# Patient Record
Sex: Female | Born: 2000 | Race: Black or African American | Hispanic: No | Marital: Single | Smoking: Never smoker
Health system: Southern US, Community
[De-identification: ages and names within clinical notes are randomized; demographics above are authoritative.]

---
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
## 2022-10-30 IMAGING — US US OB < 14 WEEKS - US OB TV
1 series · 15 of 28 positions shown · non-contrast
Comparison: None.

CLINICAL DATA: Initial evaluation for pelvic pain, early pregnancy.

EXAM:
OBSTETRIC <14 WK US AND TRANSVAGINAL OB US
TECHNIQUE: Both transabdominal and transvaginal ultrasound examinations were
performed for complete evaluation of the gestation as well as the
maternal uterus, adnexal regions, and pelvic cul-de-sac.
Transvaginal technique was performed to assess early pregnancy.

[Series 1: us ob < 14 weeks - us ob tv · 15 of 45 slices shown]
[im 1/45]
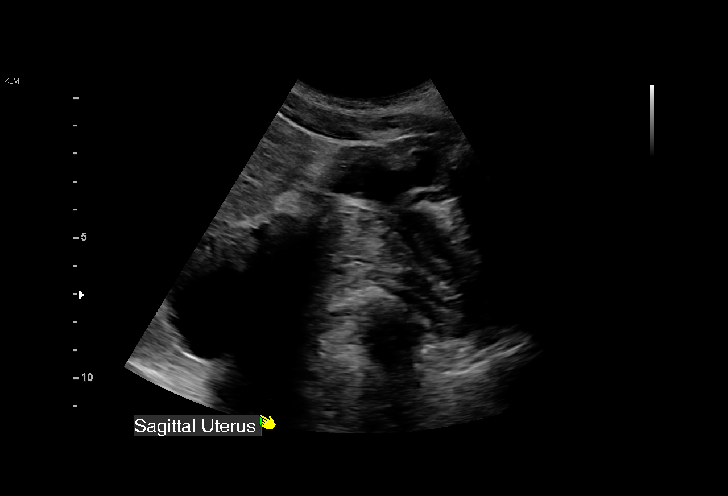
[im 4/45]
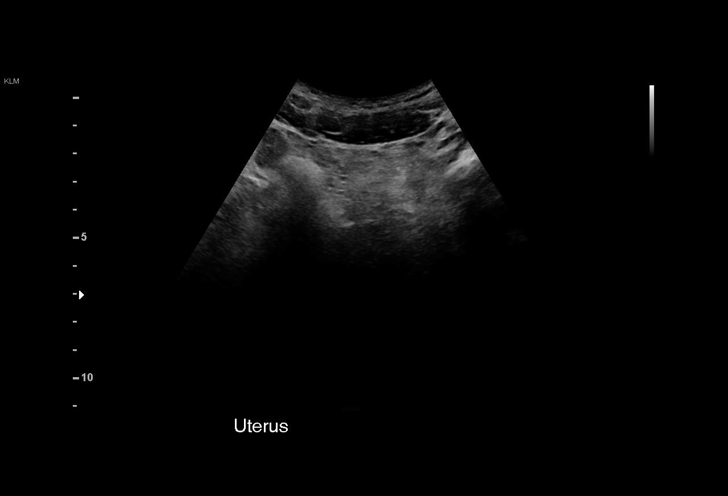
[im 7/45]
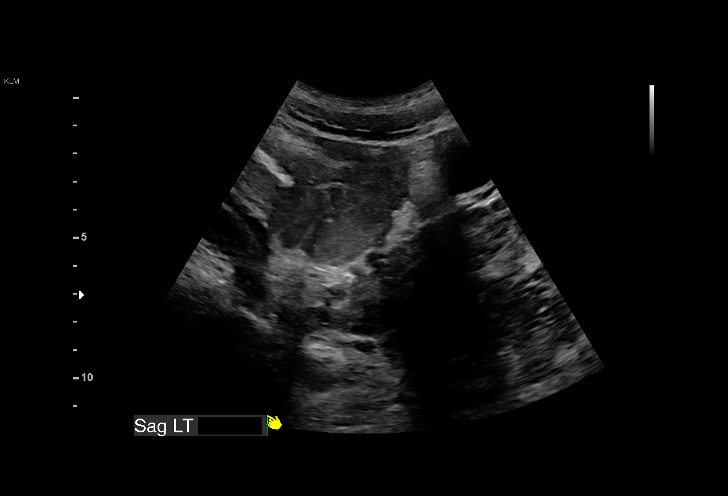
[im 10/45]
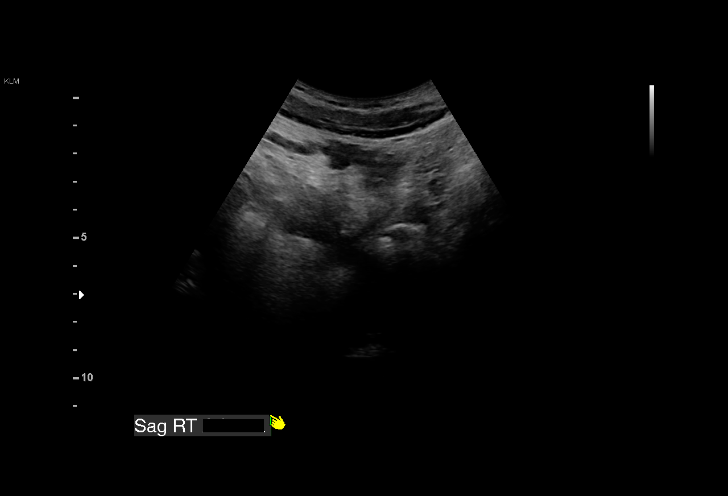
[im 14/45]
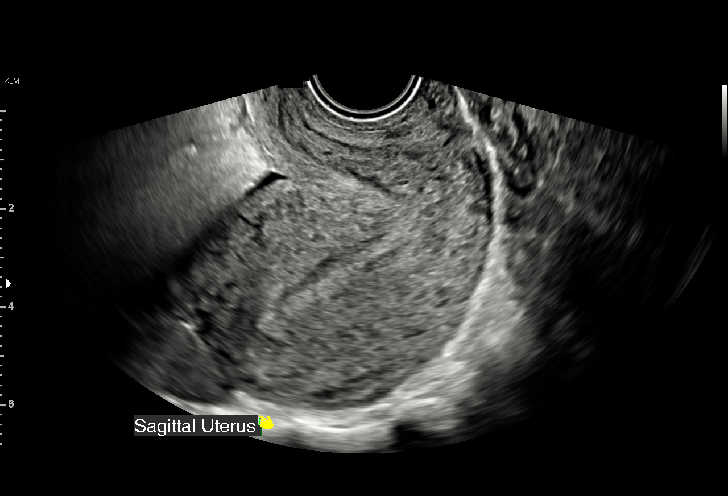
[im 17/45]
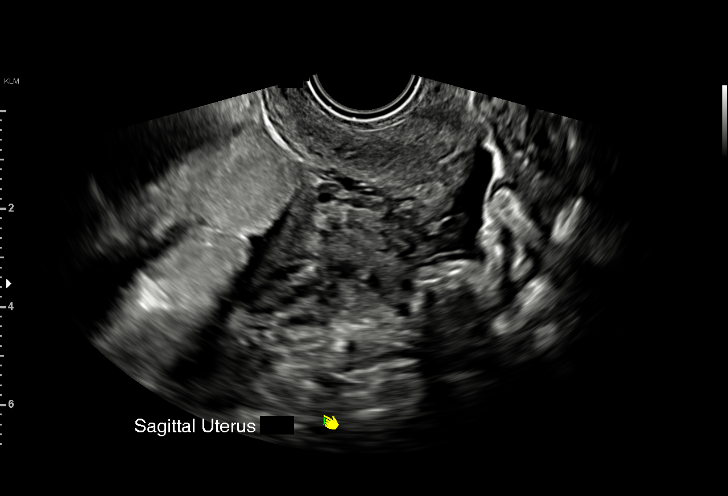
[im 20/45]
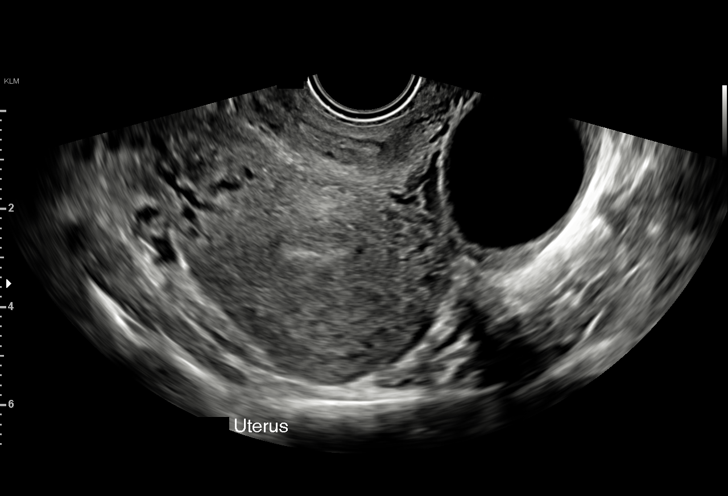
[im 23/45]
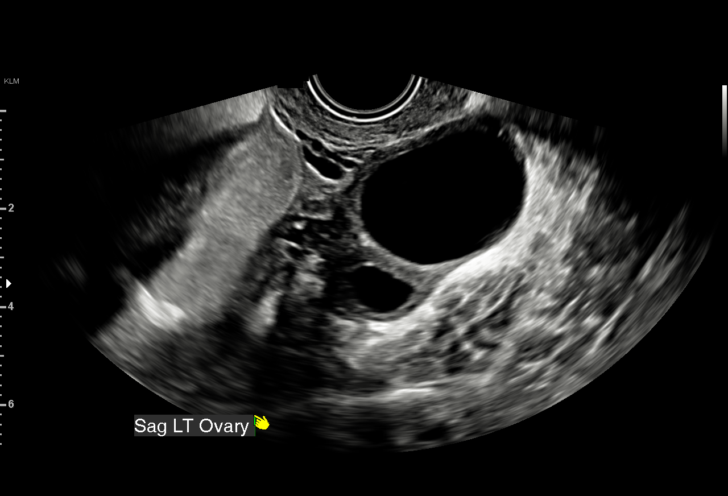
[im 25/45]
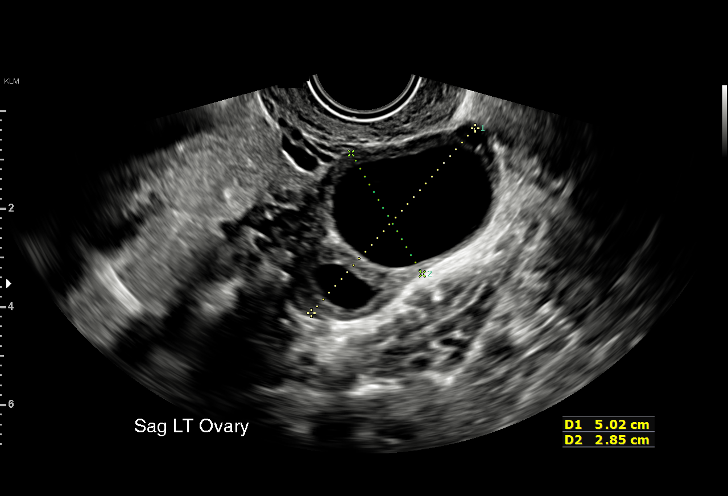
[im 28/45]
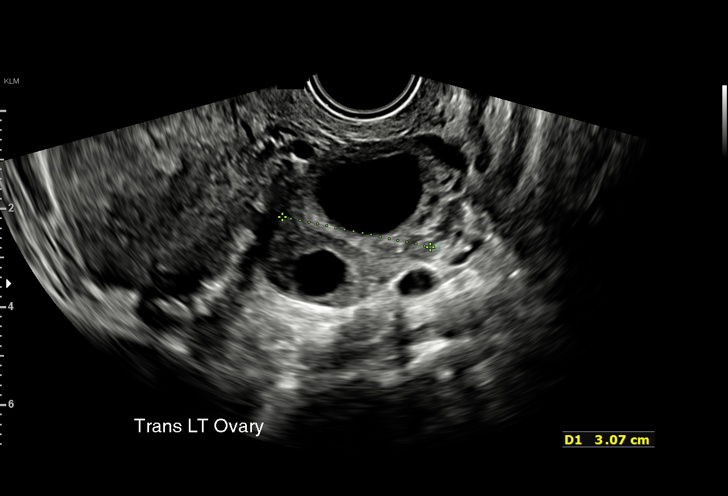
[im 31/45]
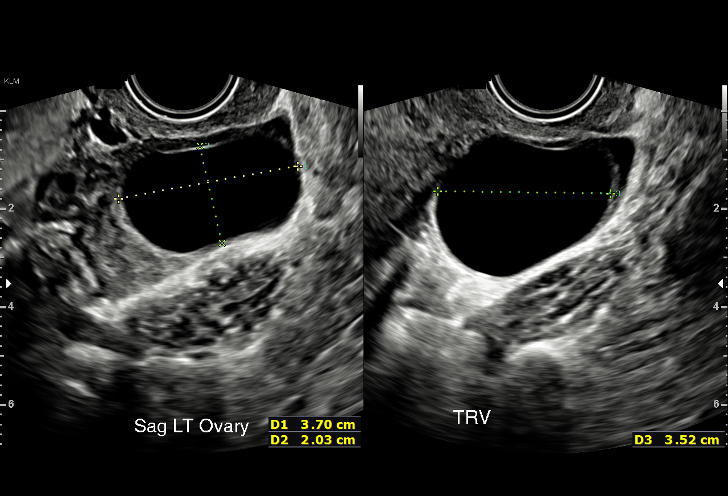
[im 35/45]
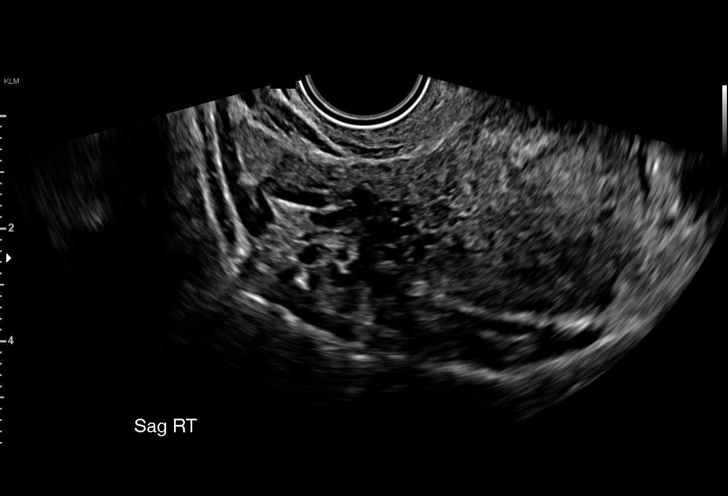
[im 38/45]
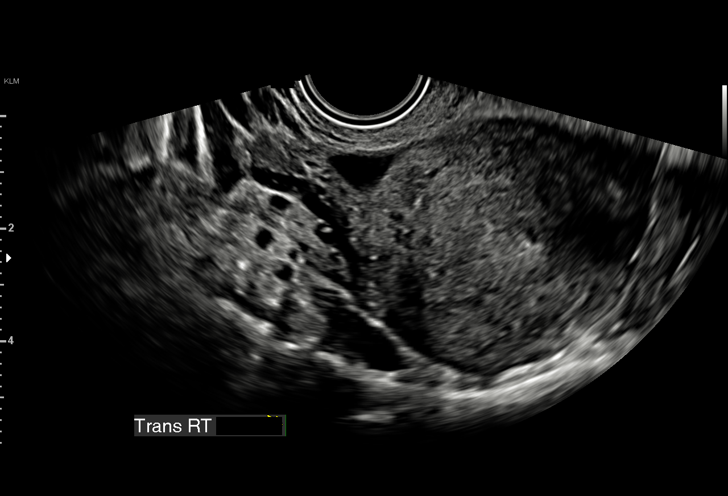
[im 41/45]
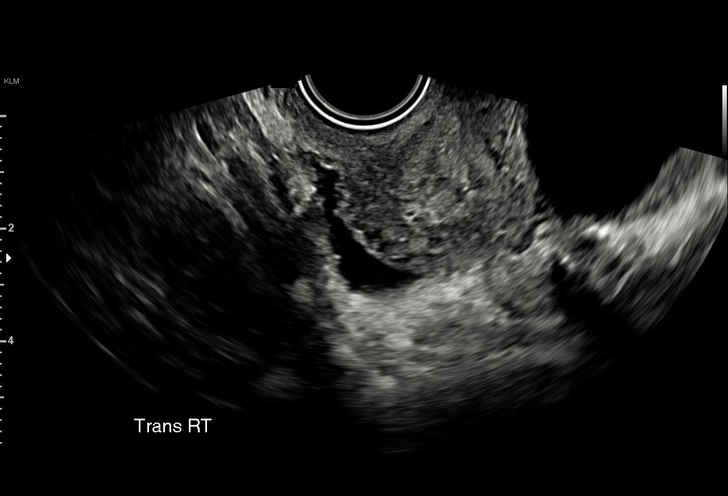
[im 45/45]
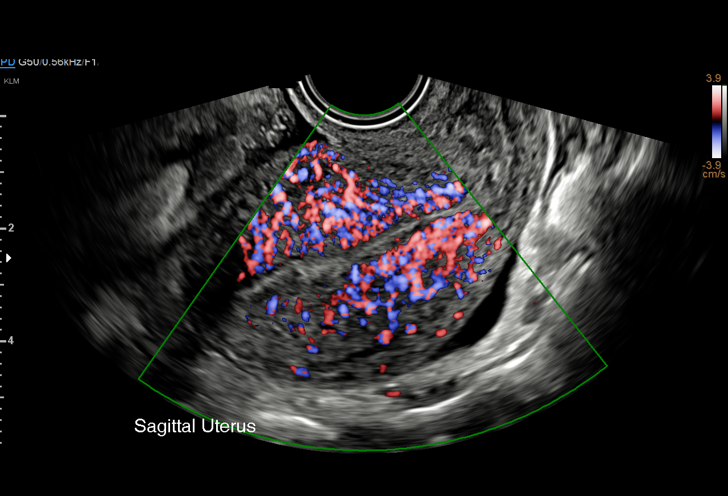

[15 of 28 positions shown; findings below may reference images not displayed]

FINDINGS: Intrauterine gestational sac: Negative.

Yolk sac:  Negative.

Embryo:  Negative.

Cardiac Activity: Negative.

Subchorionic hemorrhage:  None visualized.

Maternal uterus/adnexae: Right ovary within normal limits. 3.7 x
x 3.5 cm simple cyst seen within the left ovary. Associated small
volume free fluid within the pelvis.

Uterus itself is within normal limits. Endometrial stripe is thin
measuring 3.5 mm in thickness.
IMPRESSION: 1. Early pregnancy with no discrete IUP or concerning adnexal mass
identified. Finding is consistent with a pregnancy of unknown
anatomic location. Differential considerations include IUP to early
to visualize, recent SAB, or possibly occult ectopic pregnancy.
Close clinical monitoring with serial beta HCGs and close interval
follow-up ultrasound recommended as clinically warranted.
2. 3.7 cm simple left ovarian cyst. No follow up imaging
recommended. Note: This recommendation does not apply to
premenarchal patients or to those with increased risk (genetic,
family history, elevated tumor markers or other high-risk factors)
of ovarian cancer. Reference: Radiology [DATE]):[id number].
